# Patient Record
Sex: Male | Born: 1980 | Race: White | Hispanic: No | Marital: Single | Smoking: Light tobacco smoker
Health system: Southern US, Community
[De-identification: ages and names within clinical notes are randomized; demographics above are authoritative.]

## PROBLEM LIST (undated history)

## (undated) DIAGNOSIS — E271 Primary adrenocortical insufficiency: Secondary | ICD-10-CM

---

## 2015-08-28 ENCOUNTER — Inpatient Hospital Stay
Admission: EM | Admit: 2015-08-28 | Discharge: 2015-08-30 | DRG: 643 | Disposition: A | Discharge status: Home / Self Care | Payer: Medicare Other | Attending: Internal Medicine | Admitting: Internal Medicine

## 2015-08-28 ENCOUNTER — Emergency Department: Payer: Medicare Other

## 2015-08-28 ENCOUNTER — Encounter: Payer: Self-pay | Admitting: Emergency Medicine

## 2015-08-28 DIAGNOSIS — E271 Primary adrenocortical insufficiency: Secondary | ICD-10-CM

## 2015-08-28 DIAGNOSIS — H109 Unspecified conjunctivitis: Secondary | ICD-10-CM | POA: Diagnosis present

## 2015-08-28 DIAGNOSIS — G9341 Metabolic encephalopathy: Secondary | ICD-10-CM | POA: Diagnosis present

## 2015-08-28 DIAGNOSIS — E871 Hypo-osmolality and hyponatremia: Secondary | ICD-10-CM

## 2015-08-28 DIAGNOSIS — E872 Acidosis, unspecified: Secondary | ICD-10-CM

## 2015-08-28 DIAGNOSIS — E162 Hypoglycemia, unspecified: Secondary | ICD-10-CM | POA: Diagnosis present

## 2015-08-28 DIAGNOSIS — E861 Hypovolemia: Secondary | ICD-10-CM | POA: Diagnosis present

## 2015-08-28 DIAGNOSIS — R410 Disorientation, unspecified: Secondary | ICD-10-CM

## 2015-08-28 DIAGNOSIS — E860 Dehydration: Secondary | ICD-10-CM | POA: Diagnosis present

## 2015-08-28 DIAGNOSIS — Z720 Tobacco use: Secondary | ICD-10-CM

## 2015-08-28 DIAGNOSIS — E272 Addisonian crisis: Principal | ICD-10-CM | POA: Diagnosis present

## 2015-08-28 DIAGNOSIS — N170 Acute kidney failure with tubular necrosis: Secondary | ICD-10-CM | POA: Diagnosis present

## 2015-08-28 DIAGNOSIS — Z9114 Patient's other noncompliance with medication regimen: Secondary | ICD-10-CM

## 2015-08-28 HISTORY — DX: Primary adrenocortical insufficiency: E27.1

## 2015-08-28 LAB — CBC WITH DIFFERENTIAL/PLATELET
BASOS ABS: 0 10*3/uL (ref 0–0.1)
Basophils Relative: 0 %
EOS ABS: 0.2 10*3/uL (ref 0–0.7)
EOS PCT: 3 %
HCT: 40.6 % (ref 40.0–52.0)
Hemoglobin: 13.3 g/dL (ref 13.0–18.0)
LYMPHS PCT: 28 %
Lymphs Abs: 2.1 10*3/uL (ref 1.0–3.6)
MCH: 27.4 pg (ref 26.0–34.0)
MCHC: 32.8 g/dL (ref 32.0–36.0)
MCV: 83.5 fL (ref 80.0–100.0)
MONO ABS: 0.8 10*3/uL (ref 0.2–1.0)
Monocytes Relative: 10 %
Neutro Abs: 4.3 10*3/uL (ref 1.4–6.5)
Neutrophils Relative %: 59 %
PLATELETS: 251 10*3/uL (ref 150–440)
RBC: 4.85 MIL/uL (ref 4.40–5.90)
RDW: 16.3 % — AB (ref 11.5–14.5)
WBC: 7.4 10*3/uL (ref 3.8–10.6)

## 2015-08-28 MED ORDER — DEXTROSE-NACL 5-0.45 % IV SOLN
INTRAVENOUS | Status: DC
  Administered 2015-08-28 – 2015-08-29 (×2): via INTRAVENOUS

## 2015-08-28 MED ORDER — HYDROCORTISONE NA SUCCINATE PF 100 MG IJ SOLR
INTRAMUSCULAR | Status: AC
  Filled 2015-08-28: qty 2

## 2015-08-28 MED ORDER — HYDROCORTISONE NA SUCCINATE PF 100 MG IJ SOLR
100.0000 mg | Freq: Once | INTRAMUSCULAR | Status: AC
  Administered 2015-08-28: 100 mg via INTRAVENOUS

## 2015-08-28 NOTE — ED Notes (Signed)
MD at bedside. 

## 2015-08-28 NOTE — ED Notes (Signed)
Pt arrived via EMS from [employer]; a trucker called 911 to report pt in truck for several days and needed a welfare check; pt arrived awake and alert; confused to place and time; eyes red with large circles around eyes; eyes watering; pt denies pain; pt denies fall/injury;

## 2015-08-28 NOTE — ED Notes (Signed)
Pt's father called to check on pt; unaware of who reported pt's arrival to ED; father says he's in [place] and will coming this way

## 2015-08-28 NOTE — ED Provider Notes (Signed)
[facility] Emergency Department Provider Note  ____________________________________________  Time seen: Approximately 11:49 PM  I have reviewed the triage vital signs and the nursing notes.   HISTORY  Chief Complaint Altered Mental Status  Limited by confusion  HPI Benjamin Kelley is a 35 y.o. male who presents to the ED from a truck stop with a chief complaint of confusion. Patient has a medical history significant for Addison's disease. Reports he is taking his medicines twice daily as instructed; however his medicine bottle found in his truck is completely fall. 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 Patient does not recall how he got there or were he was going. Voices no complaints of pain. Denies fever, chills, chest pain, shortness breath, abdominal pain, nausea, vomiting, diarrhea. Reports recent nonproductive cough. Denies trauma, fall, injury.   Past Medical History  Diagnosis Date  . Addison's disease (HCC)     There are no active problems to display for this patient.   History reviewed. No pertinent past surgical history.  Current Outpatient Rx  Name  Route  Sig  Dispense  Refill  . fludrocortisone (FLORINEF) 0.1 MG tablet   Oral   Take 0.1 mg by mouth 2 (two) times daily.           Allergies Review of patient's allergies indicates no known allergies.  History reviewed. No pertinent family history.  Social History Social History  Substance Use Topics  . Smoking status: Never Smoker   . Smokeless tobacco: None  . Alcohol Use: No    Review of Systems Constitutional: No fever/chills Eyes: No visual changes. ENT: No sore throat. Cardiovascular: Denies chest pain. Respiratory: Positive for nonproductive cough. Denies shortness of breath. Gastrointestinal: No abdominal pain.  No nausea, no vomiting.  No diarrhea.  No  constipation. Genitourinary: Negative for dysuria. Musculoskeletal: Negative for back pain. Skin: Negative for rash. Neurological: Negative for headaches, focal weakness or numbness.  Limited by confusion; 10-point ROS otherwise negative.  ____________________________________________   PHYSICAL EXAM:  VITAL SIGNS: ED Triage Vitals  Enc Vitals Group     BP 08/28/15 2335 88/47 mmHg     Pulse Rate 08/28/15 2335 115     Resp 08/28/15 2335 21     Temp 08/28/15 2335 98.8 F (37.1 C)     Temp Source 08/28/15 2335 Oral     SpO2 08/28/15 2335 100 %     Weight 08/28/15 2335 189 lb 9.5 oz (86 kg)     Height 08/28/15 2335 5\' 6"  (1.676 m)     Head Cir --      Peak Flow --      Pain Score 08/28/15 2338 0     Pain Loc --      Pain Edu? --      Excl. in GC? --     Constitutional: Alert and oriented. Disheveled, dirty and in no acute distress. Eyes: Bilateral conjunctivae exudates. PERRL. EOMI. Head: Atraumatic. Nose: No congestion/rhinnorhea. Mouth/Throat: Mucous membranes are dry.  Oropharynx non-erythematous. Neck: No stridor.  No carotid bruits. No cervical spinal tenderness to palpation. No step-offs or deformities. Cardiovascular: Normal rate, regular rhythm. Grossly normal heart sounds.  Good peripheral circulation. Respiratory: Normal respiratory effort.  No retractions. Lungs CTAB. Gastrointestinal: Soft and nontender. No distention. No abdominal bruits. No CVA tenderness. Musculoskeletal: No lower extremity tenderness nor edema.  No joint effusions. Neurologic:  Alert and  oriented times person only. Normal speech and language. CN II-XII grossly intact. Moving all extremities 4, although delayed. No gross focal neurologic deficits are appreciated.  Skin:  Skin is warm, dry and intact. No rash noted. Psychiatric: Mood and affect are normal. Speech and behavior are normal.  ____________________________________________   LABS (all labs ordered are listed, but only abnormal  results are displayed)  Labs Reviewed  CBC WITH DIFFERENTIAL/PLATELET - Abnormal; Notable for the following:    RDW 16.3 (*)    All other components within normal limits  COMPREHENSIVE METABOLIC PANEL - Abnormal; Notable for the following:    Sodium 127 (*)    Chloride 94 (*)    CO2 17 (*)    Glucose, Bld 57 (*)    BUN 22 (*)    Creatinine, Ser 1.50 (*)    Total Bilirubin 2.4 (*)    GFR calc non Af Amer 60 (*)    Anion gap 16 (*)    All other components within normal limits  ACETAMINOPHEN LEVEL - Abnormal; Notable for the following:    Acetaminophen (Tylenol), Serum <10 (*)    All other components within normal limits  CULTURE, BLOOD (ROUTINE X 2)  CULTURE, BLOOD (ROUTINE X 2)  URINE CULTURE  ETHANOL  TROPONIN I  SALICYLATE LEVEL  URINALYSIS COMPLETEWITH MICROSCOPIC ([facility] ONLY)  URINE DRUG SCREEN, QUALITATIVE ([facility] ONLY)  LACTIC ACID, PLASMA  BLOOD GAS, VENOUS   ____________________________________________  EKG  ED ECG REPORT I, SUNG,JADE J, the attending physician, personally viewed and interpreted this ECG.   Date: 08/28/2015  EKG Time: 2335  Rate: 117  Rhythm: sinus tachycardia  Axis: Normal  Intervals:none  ST&T Change: Nonspecific  ____________________________________________  RADIOLOGY  CT head without contrast interpreted per Dr. Manus Gunning: No acute intracranial abnormality.  Portable chest x-ray (viewed by me, interpreted per Dr. Manus Gunning): No acute pulmonary process. ____________________________________________   PROCEDURES  Procedure(s) performed: None  Critical Care performed: Yes, see critical care note(s)   CRITICAL CARE Performed by: Irean Hong   Total critical care time: 30 minutes  Critical care time was exclusive of separately billable procedures and treating other patients.  Critical care was necessary to treat or prevent imminent or life-threatening deterioration.  Critical care was time spent personally by me on the  following activities: development of treatment plan with patient and/or surrogate as well as nursing, discussions with consultants, evaluation of patient's response to treatment, examination of patient, obtaining history from patient or surrogate, ordering and performing treatments and interventions, ordering and review of laboratory studies, ordering and review of radiographic studies, pulse oximetry and re-evaluation of patient's condition.  ____________________________________________   INITIAL IMPRESSION / ASSESSMENT AND PLAN / ED COURSE  Pertinent labs & imaging results that were available during my care of the patient were reviewed by me and considered in my medical decision making (see chart for details).  35 year old male with a history of Addison's disease who presents with altered mental status. He is unlikely taking his Florinef as the bottle is completely full. Will initiate stress dose hydrocortisone. D5 1/2NS to be started for patients with blood sugar of 60. Will obtain screening lab work including lactate, venous blood gas and obtain CT imaging of head to evaluate for intracranial hemorrhage.  ----------------------------------------- 12:52 AM on 08/29/2015 -----------------------------------------  Patient is resting in no acute distress. Updated him of laboratory and imaging studies. Will discuss with hospitalist for evaluation in the ED for admission. ____________________________________________   FINAL CLINICAL IMPRESSION(S) / ED DIAGNOSES  Final diagnoses:  Hypoglycemia  Addison disease (HCC)  Hyponatremia  Confusion  Metabolic acidosis      Irean Hong, MD 08/29/15 [phone]

## 2015-08-29 ENCOUNTER — Encounter: Payer: Self-pay | Admitting: *Deleted

## 2015-08-29 ENCOUNTER — Emergency Department: Payer: Medicare Other

## 2015-08-29 DIAGNOSIS — E871 Hypo-osmolality and hyponatremia: Secondary | ICD-10-CM | POA: Diagnosis present

## 2015-08-29 DIAGNOSIS — E860 Dehydration: Secondary | ICD-10-CM | POA: Diagnosis present

## 2015-08-29 DIAGNOSIS — Z9114 Patient's other noncompliance with medication regimen: Secondary | ICD-10-CM | POA: Diagnosis not present

## 2015-08-29 DIAGNOSIS — N170 Acute kidney failure with tubular necrosis: Secondary | ICD-10-CM | POA: Diagnosis present

## 2015-08-29 DIAGNOSIS — E271 Primary adrenocortical insufficiency: Secondary | ICD-10-CM | POA: Diagnosis present

## 2015-08-29 DIAGNOSIS — G9341 Metabolic encephalopathy: Secondary | ICD-10-CM | POA: Diagnosis present

## 2015-08-29 DIAGNOSIS — E861 Hypovolemia: Secondary | ICD-10-CM | POA: Diagnosis present

## 2015-08-29 DIAGNOSIS — E162 Hypoglycemia, unspecified: Secondary | ICD-10-CM | POA: Diagnosis present

## 2015-08-29 DIAGNOSIS — E272 Addisonian crisis: Secondary | ICD-10-CM | POA: Diagnosis present

## 2015-08-29 DIAGNOSIS — E872 Acidosis: Secondary | ICD-10-CM | POA: Diagnosis present

## 2015-08-29 DIAGNOSIS — H109 Unspecified conjunctivitis: Secondary | ICD-10-CM | POA: Diagnosis present

## 2015-08-29 DIAGNOSIS — Z720 Tobacco use: Secondary | ICD-10-CM | POA: Diagnosis not present

## 2015-08-29 LAB — BASIC METABOLIC PANEL
Anion gap: 8 (ref 5–15)
BUN: 14 mg/dL (ref 6–20)
CALCIUM: 8.8 mg/dL — AB (ref 8.9–10.3)
CO2: 19 mmol/L — ABNORMAL LOW (ref 22–32)
CREATININE: 0.98 mg/dL (ref 0.61–1.24)
Chloride: 102 mmol/L (ref 101–111)
GFR calc non Af Amer: >60 mL/min (ref >60)
Glucose, Bld: 317 mg/dL — ABNORMAL HIGH (ref 65–99)
Potassium: 4.3 mmol/L (ref 3.5–5.1)
SODIUM: 129 mmol/L — AB (ref 135–145)

## 2015-08-29 LAB — URINALYSIS COMPLETE WITH MICROSCOPIC (ARMC ONLY)
BACTERIA UA: NONE SEEN
Bilirubin Urine: NEGATIVE
GLUCOSE, UA: NEGATIVE mg/dL
LEUKOCYTES UA: NEGATIVE
Nitrite: NEGATIVE
PROTEIN: NEGATIVE mg/dL
SQUAMOUS EPITHELIAL / LPF: NONE SEEN
Specific Gravity, Urine: 1.008 (ref 1.005–1.030)
pH: 5 (ref 5.0–8.0)

## 2015-08-29 LAB — BLOOD GAS, VENOUS
Acid-base deficit: 10.3 mmol/L — ABNORMAL HIGH (ref 0.0–2.0)
BICARBONATE: 15.6 meq/L — AB (ref 21.0–28.0)
FIO2: 0.21
PCO2 VEN: 34 mmHg — AB (ref 44.0–60.0)
PH VEN: 7.27 — AB (ref 7.320–7.430)
Patient temperature: 37

## 2015-08-29 LAB — COMPREHENSIVE METABOLIC PANEL
ALBUMIN: 3.8 g/dL (ref 3.5–5.0)
ALK PHOS: 81 U/L (ref 38–126)
ALT: 16 U/L — ABNORMAL LOW (ref 17–63)
ALT: 20 U/L (ref 17–63)
ANION GAP: 10 (ref 5–15)
ANION GAP: 16 — AB (ref 5–15)
AST: 17 U/L (ref 15–41)
AST: 22 U/L (ref 15–41)
Albumin: 3.1 g/dL — ABNORMAL LOW (ref 3.5–5.0)
Alkaline Phosphatase: 64 U/L (ref 38–126)
BILIRUBIN TOTAL: 2.4 mg/dL — AB (ref 0.3–1.2)
BUN: 18 mg/dL (ref 6–20)
BUN: 22 mg/dL — ABNORMAL HIGH (ref 6–20)
CALCIUM: 9.5 mg/dL (ref 8.9–10.3)
CHLORIDE: 99 mmol/L — AB (ref 101–111)
CO2: 17 mmol/L — AB (ref 22–32)
CO2: 18 mmol/L — ABNORMAL LOW (ref 22–32)
Calcium: 8.6 mg/dL — ABNORMAL LOW (ref 8.9–10.3)
Chloride: 94 mmol/L — ABNORMAL LOW (ref 101–111)
Creatinine, Ser: 1.3 mg/dL — ABNORMAL HIGH (ref 0.61–1.24)
Creatinine, Ser: 1.5 mg/dL — ABNORMAL HIGH (ref 0.61–1.24)
GFR calc non Af Amer: 60 mL/min — ABNORMAL LOW (ref >60)
GFR calc non Af Amer: >60 mL/min (ref >60)
GLUCOSE: 57 mg/dL — AB (ref 65–99)
Glucose, Bld: 382 mg/dL — ABNORMAL HIGH (ref 65–99)
POTASSIUM: 4.2 mmol/L (ref 3.5–5.1)
POTASSIUM: 4.4 mmol/L (ref 3.5–5.1)
SODIUM: 127 mmol/L — AB (ref 135–145)
Sodium: 127 mmol/L — ABNORMAL LOW (ref 135–145)
TOTAL PROTEIN: 7.4 g/dL (ref 6.5–8.1)
Total Bilirubin: 2.1 mg/dL — ABNORMAL HIGH (ref 0.3–1.2)
Total Protein: 6.1 g/dL — ABNORMAL LOW (ref 6.5–8.1)

## 2015-08-29 LAB — CBC
HEMATOCRIT: 33.3 % — AB (ref 40.0–52.0)
Hemoglobin: 11 g/dL — ABNORMAL LOW (ref 13.0–18.0)
MCH: 27.5 pg (ref 26.0–34.0)
MCHC: 33.1 g/dL (ref 32.0–36.0)
MCV: 83.2 fL (ref 80.0–100.0)
Platelets: 212 10*3/uL (ref 150–440)
RBC: 4 MIL/uL — AB (ref 4.40–5.90)
RDW: 16.1 % — ABNORMAL HIGH (ref 11.5–14.5)
WBC: 4.1 10*3/uL (ref 3.8–10.6)

## 2015-08-29 LAB — TSH: TSH: 5.8 u[IU]/mL — ABNORMAL HIGH (ref 0.350–4.500)

## 2015-08-29 LAB — URINE DRUG SCREEN, QUALITATIVE (ARMC ONLY)
AMPHETAMINES, UR SCREEN: NOT DETECTED
Barbiturates, Ur Screen: NOT DETECTED
Benzodiazepine, Ur Scrn: NOT DETECTED
COCAINE METABOLITE, UR ~~LOC~~: NOT DETECTED
Cannabinoid 50 Ng, Ur ~~LOC~~: NOT DETECTED
MDMA (ECSTASY) UR SCREEN: NOT DETECTED
METHADONE SCREEN, URINE: NOT DETECTED
Opiate, Ur Screen: NOT DETECTED
Phencyclidine (PCP) Ur S: NOT DETECTED
TRICYCLIC, UR SCREEN: NOT DETECTED

## 2015-08-29 LAB — GLUCOSE, CAPILLARY
GLUCOSE-CAPILLARY: 198 mg/dL — AB (ref 65–99)
GLUCOSE-CAPILLARY: 224 mg/dL — AB (ref 65–99)
GLUCOSE-CAPILLARY: 255 mg/dL — AB (ref 65–99)
GLUCOSE-CAPILLARY: 289 mg/dL — AB (ref 65–99)
GLUCOSE-CAPILLARY: 60 mg/dL — AB (ref 65–99)
Glucose-Capillary: 280 mg/dL — ABNORMAL HIGH (ref 65–99)

## 2015-08-29 LAB — CK: Total CK: 63 U/L (ref 49–397)

## 2015-08-29 LAB — AMMONIA: Ammonia: 19 umol/L (ref 9–35)

## 2015-08-29 LAB — ETHANOL: Alcohol, Ethyl (B): <5 mg/dL (ref <5)

## 2015-08-29 LAB — TROPONIN I: TROPONIN I: 0.03 ng/mL (ref <0.031)

## 2015-08-29 LAB — SALICYLATE LEVEL

## 2015-08-29 LAB — LACTIC ACID, PLASMA: LACTIC ACID, VENOUS: 0.9 mmol/L (ref 0.5–2.0)

## 2015-08-29 LAB — ACETAMINOPHEN LEVEL

## 2015-08-29 MED ORDER — ONDANSETRON HCL 4 MG PO TABS: 4.0000 mg | ORAL_TABLET | Freq: Four times a day (QID) | ORAL | Status: DC | PRN

## 2015-08-29 MED ORDER — SODIUM CHLORIDE 0.9 % IJ SOLN
3.0000 mL | Freq: Two times a day (BID) | INTRAMUSCULAR | Status: DC
Start: 2015-08-29 — End: 2015-08-30
  Administered 2015-08-29 (×2): 3 mL via INTRAVENOUS

## 2015-08-29 MED ORDER — POLYETHYLENE GLYCOL 3350 17 G PO PACK: 17.0000 g | PACK | Freq: Every day | ORAL | Status: DC | PRN

## 2015-08-29 MED ORDER — ALUM & MAG HYDROXIDE-SIMETH 200-200-20 MG/5ML PO SUSP: 30.0000 mL | Freq: Four times a day (QID) | ORAL | Status: DC | PRN

## 2015-08-29 MED ORDER — ENOXAPARIN SODIUM 40 MG/0.4ML ~~LOC~~ SOLN
40.0000 mg | SUBCUTANEOUS | Status: DC
  Administered 2015-08-29: 40 mg via SUBCUTANEOUS
  Filled 2015-08-29: qty 0.4

## 2015-08-29 MED ORDER — HYDROCORTISONE NA SUCCINATE PF 100 MG IJ SOLR
100.0000 mg | Freq: Three times a day (TID) | INTRAMUSCULAR | Status: AC
  Administered 2015-08-29 (×3): 100 mg via INTRAVENOUS
  Filled 2015-08-29 (×4): qty 2

## 2015-08-29 MED ORDER — DEXTROSE 5 % IV SOLN
INTRAVENOUS | Status: DC
  Administered 2015-08-29: 11:00:00 via INTRAVENOUS

## 2015-08-29 MED ORDER — ACETAMINOPHEN 325 MG PO TABS: 650.0000 mg | ORAL_TABLET | Freq: Four times a day (QID) | ORAL | Status: DC | PRN

## 2015-08-29 MED ORDER — DEXTROSE-NACL 5-0.9 % IV SOLN
INTRAVENOUS | Status: DC
  Administered 2015-08-29: 03:00:00 via INTRAVENOUS

## 2015-08-29 MED ORDER — FLUDROCORTISONE ACETATE 0.1 MG PO TABS
0.1000 mg | ORAL_TABLET | Freq: Two times a day (BID) | ORAL | Status: DC
  Administered 2015-08-29 – 2015-08-30 (×4): 0.1 mg via ORAL
  Filled 2015-08-29 (×4): qty 1

## 2015-08-29 MED ORDER — TOBRAMYCIN 0.3 % OP SOLN
2.0000 [drp] | OPHTHALMIC | Status: DC
  Administered 2015-08-29 – 2015-08-30 (×10): 2 [drp] via OPHTHALMIC
  Filled 2015-08-29: qty 5

## 2015-08-29 MED ORDER — SODIUM CHLORIDE 0.9 % IV BOLUS (SEPSIS)
2000.0000 mL | Freq: Once | INTRAVENOUS | Status: AC
Start: 2015-08-29 — End: 2015-08-29
  Administered 2015-08-29: 1000 mL via INTRAVENOUS

## 2015-08-29 MED ORDER — INSULIN ASPART 100 UNIT/ML ~~LOC~~ SOLN
0.0000 [IU] | Freq: Three times a day (TID) | SUBCUTANEOUS | Status: DC
  Administered 2015-08-29: 3 [IU] via SUBCUTANEOUS
  Administered 2015-08-29: 5 [IU] via SUBCUTANEOUS
  Administered 2015-08-30: 2 [IU] via SUBCUTANEOUS
  Administered 2015-08-30: 1 [IU] via SUBCUTANEOUS
  Administered 2015-08-30: 2 [IU] via SUBCUTANEOUS
  Filled 2015-08-29: qty 1
  Filled 2015-08-29: qty 2
  Filled 2015-08-29: qty 3
  Filled 2015-08-29: qty 5

## 2015-08-29 MED ORDER — ONDANSETRON HCL 4 MG/2ML IJ SOLN: 4.0000 mg | Freq: Four times a day (QID) | INTRAMUSCULAR | Status: DC | PRN

## 2015-08-29 MED ORDER — ACETAMINOPHEN 650 MG RE SUPP
650.0000 mg | Freq: Four times a day (QID) | RECTAL | Status: DC | PRN
Start: 2015-08-29 — End: 2015-08-30

## 2015-08-29 MED ORDER — ALBUTEROL SULFATE (2.5 MG/3ML) 0.083% IN NEBU: 2.5000 mg | INHALATION_SOLUTION | RESPIRATORY_TRACT | Status: DC | PRN

## 2015-08-29 MED ORDER — SODIUM CHLORIDE 0.9 % IV SOLN
INTRAVENOUS | Status: DC
  Administered 2015-08-29 (×2): via INTRAVENOUS

## 2015-08-29 NOTE — ED Notes (Signed)
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

## 2015-08-29 NOTE — Progress Notes (Signed)
[facility] - [place] at [facility]   PATIENT NAME: Benjamin Kelley    MR#:  [id number]  DATE OF BIRTH:  1981-08-16  SUBJECTIVE:   Patient was brought in for altered mental status and confusion and found to have hypotension and mild hypoglycemia and thought to have adrenal crisis.  REVIEW OF SYSTEMS:    Review of Systems  Constitutional: Negative for fever, chills and malaise/fatigue.  HENT: Negative for sore throat.   Eyes: Negative for blurred vision.  Respiratory: Negative for cough, hemoptysis, shortness of breath and wheezing.   Cardiovascular: Negative for chest pain, palpitations and leg swelling.  Gastrointestinal: Negative for nausea, vomiting, abdominal pain, diarrhea and blood in stool.  Genitourinary: Negative for dysuria.  Musculoskeletal: Negative for back pain.  Neurological: Negative for dizziness, tremors and headaches.  Endo/Heme/Allergies: Does not bruise/bleed easily.    Tolerating Diet: Yes      DRUG ALLERGIES:  No Known Allergies  VITALS:  Blood pressure 108/66, pulse 112, temperature 100.2 F (37.9 C), temperature source Oral, resp. rate 24, height 5\' 6"  (1.676 m), weight 71.487 kg (157 lb 9.6 oz), SpO2 100 %.  PHYSICAL EXAMINATION:   Physical Exam  Constitutional: He is oriented to person, place, and time and well-developed, well-nourished, and in no distress. No distress.  Disheveled  HENT:  Head: Normocephalic.  Eyes: No scleral icterus.  Neck: Normal range of motion. Neck supple. No JVD present. No tracheal deviation present.  Cardiovascular: Normal rate, regular rhythm and normal heart sounds.  Exam reveals no gallop and no friction rub.   No murmur heard. Pulmonary/Chest: Effort normal and breath sounds normal. No respiratory distress. He has no wheezes. He has no rales. He exhibits no tenderness.  Abdominal: Soft. Bowel sounds are normal. He exhibits no distension and no mass. There is no tenderness. There is  no rebound and no guarding.  Musculoskeletal: Normal range of motion. He exhibits no edema.  Neurological: He is alert and oriented to person, place, and time.  Skin: Skin is warm. No rash noted. No erythema.  Chronic skin changes lower extremity  Psychiatric: Affect and judgment normal.      LABORATORY PANEL:   CBC  Recent Labs Lab 08/29/15 0451  WBC 4.1  HGB 11.0*  HCT 33.3*  PLT 212   ------------------------------------------------------------------------------------------------------------------  Chemistries   Recent Labs Lab 08/29/15 0451  NA 127*  K 4.2  CL 99*  CO2 18*  GLUCOSE 382*  BUN 18  CREATININE 1.30*  CALCIUM 8.6*  AST 17  ALT 16*  ALKPHOS 64  BILITOT 2.1*   ------------------------------------------------------------------------------------------------------------------  Cardiac Enzymes  Recent Labs Lab 08/28/15 2345  TROPONINI 0.03   ------------------------------------------------------------------------------------------------------------------  RADIOLOGY:  Ct Head Wo Contrast  08/29/2015  CLINICAL DATA:  Altered mental status. EXAM: CT HEAD WITHOUT CONTRAST TECHNIQUE: Contiguous axial images were obtained from the base of the skull through the vertex without intravenous contrast. COMPARISON:  None. FINDINGS: No intracranial hemorrhage, mass effect, or midline shift. No hydrocephalus. The basilar cisterns are patent. No evidence of territorial infarct. No intracranial fluid collection. Mild scalp thickening of the posterior vertex. Calvarium is intact. Minimal mucosal thickening of the ethmoid air cells, no fluid levels. The mastoid air cells are well aerated. IMPRESSION: No acute intracranial abnormality. Electronically Signed   By: Rubye Oaks M.D.   On: 08/29/2015 00:37   Dg Chest Port 1 View  08/29/2015  CLINICAL DATA:  Altered mental status, confusion. EXAM: PORTABLE CHEST 1 VIEW COMPARISON:  None. FINDINGS:  The cardiomediastinal  contours are normal. The lungs are clear. Pulmonary vasculature is normal. No consolidation, pleural effusion, or pneumothorax. No acute osseous abnormalities are seen. IMPRESSION: No acute pulmonary process. Electronically Signed   By: Rubye OaksMelanie  Ehinger M.D.   On: 08/29/2015 00:47     ASSESSMENT AND PLAN:   35 year old male with history of adrenal crisis on Florinef at home brought in for altered mental status. Patient reports that he stopped taking his medications for the past few days.  1. Adrenal crisis: This is due to noncompliance with Florinef. Continue high-dose hydrocortisone steroids. Patient is restarted on Florinef. Endocrine consultation. Continue IV fluids.  2. Hypoglycemia: Patient is on D5 half-normal saline. Blood sugars are elevated.  I will repeat sodium level. If sodium level has improved then I can stop D5 half-normal saline.   3. Metabolic encephalopathy: Due to adrenal crisis. This is resolved.   4. Hyponatremia: Likely in the setting of hypovolemia and adrenal crises. Repeat sodium level. If improved then we'll consider stopping D5.  5. Acute kidney injury: This is due to hypovolemia. Continue IV fluids.     Management plans discussed with the patient and he is in agreement.  CODE STATUS: Full  TOTAL TIME TAKING CARE OF THIS PATIENT: 30 minutes.     POSSIBLE D/C 1-2 days, DEPENDING ON CLINICAL CONDITION.   Terell Kincy M.D on 08/29/2015 at 10:35 AM  Between 7am to 6pm - Pager - [phone] After 6pm go to [email or web address] - password [email or web address] [facility]  [facility]  Office  [phone]  CC: Primary care physician; No primary care provider on file.  Note: This dictation was prepared with Dragon dictation along with smaller phrase technology. Any transcriptional errors that result from this process are unintentional.

## 2015-08-29 NOTE — H&P (Signed)
[facility] - [place] at [facility]   PATIENT NAME: Benjamin Kelley    MR#:  [id number]  DATE OF BIRTH:  [birth year]  DATE OF ADMISSION:  08/28/2015  PRIMARY CARE PHYSICIAN: No primary care provider on file.   REQUESTING/REFERRING PHYSICIAN: Dr. Dolores Frame  CHIEF COMPLAINT:   Chief Complaint  Patient presents with  . Altered Mental Status    HISTORY OF PRESENT ILLNESS:  Benjamin Kelley  is a 35 y.o. 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 A fellow trucker called police for a social welfare check. Patient was found to have altered mental status/confusion and was brought to the emergency room. Here he had hypotension with systolic in the 80s, mild hypoglycemia at 57. Severely dehydrated. He had a bottle of Florinef which was full. Patient is from [place] and was headed for [place] in his truck. Fortunately patient's father was in [place] due to his work and was able to come to the emergency room. History obtained mostly from father. Patient able to contribute very little to history due to his confusion. Patient is only oriented to person.  He went through many similar episodes in his teenage years and was diagnosed with adrenal insufficiency and started on Florinef.  PAST MEDICAL HISTORY:   Past Medical History  Diagnosis Date  . Addison's disease (HCC)     PAST SURGICAL HISTORY:  History reviewed. No pertinent past surgical history.  SOCIAL HISTORY:   Social History  Substance Use Topics  . Smoking status: Never Smoker   . Smokeless tobacco: Not on file  . Alcohol Use: No    FAMILY HISTORY:  History reviewed. No pertinent family history.  DRUG ALLERGIES:  No Known Allergies  REVIEW OF SYSTEMS:   Review of Systems  Unable to perform ROS: mental status change    MEDICATIONS AT  HOME:   Prior to Admission medications   Medication Sig Start Date End Date Taking? Authorizing Provider  fludrocortisone (FLORINEF) 0.1 MG tablet Take 0.1 mg by mouth 2 (two) times daily.   Yes Historical Provider, MD      VITAL SIGNS:  Blood pressure 88/47, pulse 115, temperature 98.8 F (37.1 C), temperature source Oral, resp. rate 21, height 5\' 6"  (1.676 m), weight 86 kg (189 lb 9.5 oz), SpO2 100 %.  PHYSICAL EXAMINATION:  Physical Exam  GENERAL:  35 y.o.-year-old patient lying in the bed with no acute distress. Looks pale EYES: Pupils equal, round, reactive to light and accommodation. No scleral icterus. Extraocular muscles intact.  HEENT: Head atraumatic, normocephalic. Dry oral mucosa. Conjunctival injection with crusting. NECK:  Supple, no jugular venous distention. No thyroid enlargement, no tenderness.  LUNGS: Normal breath sounds bilaterally, no wheezing, rales, rhonchi. No use of accessory muscles of respiration.  CARDIOVASCULAR: S1, S2 normal. No murmurs, rubs, or gallops. Tachycardia ABDOMEN: Soft, nontender, nondistended. Bowel sounds present. No organomegaly or mass.  EXTREMITIES: No pedal edema, cyanosis, or clubbing. + 2 pedal & radial pulses b/l.   NEUROLOGIC: Cranial nerves II through XII are intact. No focal Motor or sensory deficits appreciated b/l PSYCHIATRIC: The patient is alert and awake. Restless. Oriented to person. SKIN: Dry scaly skin in lower extremity's with excoriation from scratching. No bleeding. No discharge.  LABORATORY PANEL:   CBC  Recent Labs Lab 08/28/15 2345  WBC 7.4  HGB 13.3  HCT 40.6  PLT 251   ------------------------------------------------------------------------------------------------------------------  Chemistries   Recent Labs Lab 08/28/15 2345  NA 127*  K 4.4  CL 94*  CO2 17*  GLUCOSE 57*  BUN 22*  CREATININE 1.50*  CALCIUM 9.5  AST 22  ALT 20  ALKPHOS 81  BILITOT 2.4*    ------------------------------------------------------------------------------------------------------------------  Cardiac Enzymes  Recent Labs Lab 08/28/15 2345  TROPONINI 0.03   ------------------------------------------------------------------------------------------------------------------  RADIOLOGY:  Ct Head Wo Contrast  08/29/2015  CLINICAL DATA:  Altered mental status. EXAM: CT HEAD WITHOUT CONTRAST TECHNIQUE: Contiguous axial images were obtained from the base of the skull through the vertex without intravenous contrast. COMPARISON:  None. FINDINGS: No intracranial hemorrhage, mass effect, or midline shift. No hydrocephalus. The basilar cisterns are patent. No evidence of territorial infarct. No intracranial fluid collection. Mild scalp thickening of the posterior vertex. Calvarium is intact. Minimal mucosal thickening of the ethmoid air cells, no fluid levels. The mastoid air cells are well aerated. IMPRESSION: No acute intracranial abnormality. Electronically Signed   By: Rubye OaksMelanie  Ehinger M.D.   On: 08/29/2015 00:37   Dg Chest Port 1 View  08/29/2015  CLINICAL DATA:  Altered mental status, confusion. EXAM: PORTABLE CHEST 1 VIEW COMPARISON:  None. FINDINGS: The cardiomediastinal contours are normal. The lungs are clear. Pulmonary vasculature is normal. No consolidation, pleural effusion, or pneumothorax. No acute osseous abnormalities are seen. IMPRESSION: No acute pulmonary process. Electronically Signed   By: Rubye OaksMelanie  Ehinger M.D.   On: 08/29/2015 00:47     IMPRESSION AND PLAN:   * Acute adrenal crisis Due to noncompliance with Florinef. Stat dose of Solu-Cortef IV 100 mg. Added 3 more doses. Restart Florinef. He does have mild hypotension and hypoglycemia secondary to this. Start D5 normal saline. We'll bolus 2 L normal saline first stat. Accu-Chek every 4 hours.  * Anion gap metabolic acidosis Bicarbonate levels are 17 and anion gap at 16. Check a venous blood gas. This  is likely from lactic acidosis. Check lactic acid level. Alcohol level normal. Her most Tylenol and salicylate levels.  * Acute encephalopathy Likely due to above CT scan of the head showed no acute changes. Had similar presentation during his prior episodes of adrenal crisis. Check urine drug screen. Check TSH and ammonia level.  * Acute renal failure with hyponatremia Due to severe dehydration and ATN from hypotension. On IV fluids. Check CK level. Repeat labs in the morning.  * Conjunctivitis Antibiotic eyedrops  * Hyperbilirubinemia with normal liver enzymes. No Abdominal tenderness and no history of liver disease. I suspect this is due to hemoconcentration. He will need further workup including ultrasound of the abdomen if he still has persistent hyperbilirubinemia after fluid resuscitation.  * DVT prophylaxis with Lovenox  All the records are reviewed and case discussed with ED provider. Management plans discussed with the patient, family and they are in agreement.  CODE STATUS: FULL  TOTAL CRITICAL CARE TIME TAKING CARE OF THIS PATIENT: 40 minutes.    Milagros LollSudini, Azriel Jakob R M.D on 08/29/2015 at 1:18 AM  Between 7am to 6pm - Pager - [phone]  After 6pm go to [email or web address] - password [email or web address] [facility]  [facility]  Office  [phone]  CC: Primary care physician; No primary care provider on file.  Note: This dictation was prepared with Dragon dictation along with smaller phrase technology. Any transcriptional errors that result from this process are unintentional.

## 2015-08-29 NOTE — ED Notes (Signed)
Patient transported to CT 

## 2015-08-29 NOTE — Progress Notes (Signed)
Inpatient Diabetes Program Recommendations  AACE/ADA: New Consensus Statement on Inpatient Glycemic Control (2015)  Target Ranges:  Prepandial:   less than 140 mg/dL      Peak postprandial:   less than 180 mg/dL (1-2 hours)      Critically ill patients:  140 - 180 mg/dL  Results for Benjamin Kelley, Benjamin Kelley (MRN [id number]) as of 08/29/2015 09:56  Ref. Range 08/28/2015 23:40 08/29/2015 03:35 08/29/2015 07:31  Glucose-Capillary Latest Ref Range: 65-99 mg/dL 60 (L) 409255 (H) 811289 (H)   Review of Glycemic Control  Diabetes history: No Outpatient Diabetes medications: NA Current orders for Inpatient glycemic control: None  Inpatient Diabetes Program Recommendations: Correction (SSI): Patient does not have a documented history of diabetes. Glucose 289 mg/dl this morning. Hyperglycemia likely due to steroids. While inpatient and ordered steroids, please consider ordering CBGs with Novolog sensitive correction scale.  Thanks, Orlando PennerMarie Kristen Bushway, RN, MSN, CDE Diabetes Coordinator Inpatient Diabetes Program [phone] (Team Pager from 8am to 5pm) [phone] ([facility] office) [phone] [facility] office) [phone] [facility] office)

## 2015-08-30 ENCOUNTER — Inpatient Hospital Stay (HOSPITAL_COMMUNITY)
Admit: 2015-08-30 | Discharge: 2015-08-30 | Disposition: A | Discharge status: Home / Self Care | Payer: Medicare Other | Attending: Internal Medicine | Admitting: Internal Medicine

## 2015-08-30 DIAGNOSIS — G9340 Encephalopathy, unspecified: Secondary | ICD-10-CM

## 2015-08-30 DIAGNOSIS — R509 Fever, unspecified: Secondary | ICD-10-CM

## 2015-08-30 LAB — URINE CULTURE
CULTURE: NO GROWTH
Special Requests: NORMAL

## 2015-08-30 LAB — GLUCOSE, CAPILLARY
GLUCOSE-CAPILLARY: 155 mg/dL — AB (ref 65–99)
GLUCOSE-CAPILLARY: 132 mg/dL — AB (ref 65–99)
Glucose-Capillary: 159 mg/dL — ABNORMAL HIGH (ref 65–99)

## 2015-08-30 LAB — BLOOD CULTURE ID PANEL (REFLEXED)
ACINETOBACTER BAUMANNII: NOT DETECTED
CANDIDA PARAPSILOSIS: NOT DETECTED
CARBAPENEM RESISTANCE: NOT DETECTED
Candida albicans: NOT DETECTED
Candida glabrata: NOT DETECTED
Candida krusei: NOT DETECTED
Candida tropicalis: NOT DETECTED
ENTEROCOCCUS SPECIES: NOT DETECTED
Enterobacter cloacae complex: NOT DETECTED
Enterobacteriaceae species: NOT DETECTED
Escherichia coli: NOT DETECTED
HAEMOPHILUS INFLUENZAE: NOT DETECTED
KLEBSIELLA OXYTOCA: NOT DETECTED
Klebsiella pneumoniae: NOT DETECTED
LISTERIA MONOCYTOGENES: NOT DETECTED
METHICILLIN RESISTANCE: NOT DETECTED
Neisseria meningitidis: NOT DETECTED
PROTEUS SPECIES: NOT DETECTED
PSEUDOMONAS AERUGINOSA: NOT DETECTED
SERRATIA MARCESCENS: NOT DETECTED
STREPTOCOCCUS SPECIES: NOT DETECTED
Staphylococcus aureus (BCID): NOT DETECTED
Staphylococcus species: DETECTED — AB
Streptococcus agalactiae: NOT DETECTED
Streptococcus pneumoniae: NOT DETECTED
Streptococcus pyogenes: NOT DETECTED
VANCOMYCIN RESISTANCE: NOT DETECTED

## 2015-08-30 LAB — BASIC METABOLIC PANEL
ANION GAP: 8 (ref 5–15)
BUN: 9 mg/dL (ref 6–20)
CO2: 20 mmol/L — AB (ref 22–32)
Calcium: 8.8 mg/dL — ABNORMAL LOW (ref 8.9–10.3)
Chloride: 109 mmol/L (ref 101–111)
Creatinine, Ser: 0.76 mg/dL (ref 0.61–1.24)
GFR calc Af Amer: >60 mL/min (ref >60)
GFR calc non Af Amer: >60 mL/min (ref >60)
GLUCOSE: 180 mg/dL — AB (ref 65–99)
POTASSIUM: 4 mmol/L (ref 3.5–5.1)
Sodium: 137 mmol/L (ref 135–145)

## 2015-08-30 LAB — CBC
HCT: 29.6 % — ABNORMAL LOW (ref 40.0–52.0)
Hemoglobin: 9.9 g/dL — ABNORMAL LOW (ref 13.0–18.0)
MCH: 26.9 pg (ref 26.0–34.0)
MCHC: 33.6 g/dL (ref 32.0–36.0)
MCV: 80 fL (ref 80.0–100.0)
PLATELETS: 239 10*3/uL (ref 150–440)
RBC: 3.7 MIL/uL — ABNORMAL LOW (ref 4.40–5.90)
RDW: 16 % — ABNORMAL HIGH (ref 11.5–14.5)
WBC: 3.5 10*3/uL — ABNORMAL LOW (ref 3.8–10.6)

## 2015-08-30 MED ORDER — VANCOMYCIN HCL IN DEXTROSE 750-5 MG/150ML-% IV SOLN
750.0000 mg | Freq: Three times a day (TID) | INTRAVENOUS | Status: DC
  Administered 2015-08-30: 750 mg via INTRAVENOUS
  Filled 2015-08-30 (×4): qty 150

## 2015-08-30 MED ORDER — VANCOMYCIN HCL IN DEXTROSE 750-5 MG/150ML-% IV SOLN
750.0000 mg | Freq: Once | INTRAVENOUS | Status: AC
  Administered 2015-08-30: 750 mg via INTRAVENOUS
  Filled 2015-08-30: qty 150

## 2015-08-30 MED ORDER — POLYETHYLENE GLYCOL 3350 17 G PO PACK: 17.0000 g | PACK | Freq: Every day | ORAL | Status: AC | PRN

## 2015-08-30 MED ORDER — FLUDROCORTISONE ACETATE 0.1 MG PO TABS: 0.1000 mg | ORAL_TABLET | Freq: Two times a day (BID) | ORAL | Status: DC

## 2015-08-30 MED ORDER — HYDROCORTISONE 5 MG PO TABS: ORAL_TABLET | ORAL | Status: AC

## 2015-08-30 MED ORDER — HYDROCORTISONE 10 MG PO TABS
20.0000 mg | ORAL_TABLET | Freq: Every day | ORAL | Status: DC
  Administered 2015-08-30: 20 mg via ORAL
  Filled 2015-08-30 (×2): qty 2

## 2015-08-30 MED ORDER — TOBRAMYCIN 0.3 % OP SOLN: 2.0000 [drp] | OPHTHALMIC | Status: AC

## 2015-08-30 MED ORDER — HYDROCORTISONE 10 MG PO TABS
40.0000 mg | ORAL_TABLET | Freq: Every day | ORAL | Status: DC
  Administered 2015-08-30: 40 mg via ORAL
  Filled 2015-08-30 (×2): qty 4

## 2015-08-30 MED ORDER — FLUDROCORTISONE ACETATE 0.1 MG PO TABS: 0.1000 mg | ORAL_TABLET | Freq: Two times a day (BID) | ORAL | Status: AC

## 2015-08-30 NOTE — Consult Note (Signed)
Endocrine Initial Consult Note Date of Consult: 08/30/2015  Consulting Service: [facility] Endocrinology  Service Requesting Consult: Dr. Elpidio Anis  SUBJECTIVE: Reason for Consultation: adrenal crisis  History of Present Illness: Benjamin Kelley is a 35 y.o. male with PMH Addison's disease admitted with adrenal crisis. 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 He was found in his truck after someone noticed he was parked at a station for days reportedly. He was mentally altered and upon presentation was hypotensive and hypoglycemic. The patient reports recently he was experiencing some heat intolerance and sweats but did not notice and fever. He was feeling unwell. Reports he was taking his hydrocortisone and fludrocortisone but is unable to recall his doses. He has an Actor in [place] but has not been seen in almost a year because he has been so busy. Addison's disease was diagnosed about 11 years ago in the context of weight loss, generalized weakness, nausea, abdominal pain, skin hyperpigmentation.  He is currently receiving florinef 0.1 mg twice daily. He was treated with IV hydrocortisone 100 mg q8hr upon admission but that was discontinued yesterday.  He denies any nausea, vomiting, abdominal pain. He is eating without issues and receiving IV fluids.  Patient Active Problem List   Diagnosis Date Noted  . Adrenal crisis (HCC) 08/29/2015     Past Medical History  Diagnosis Date  . Addison's disease (HCC)    History reviewed. No pertinent past surgical history. History reviewed. No pertinent family history.  Social History:  Social History  Substance Use Topics  . Smoking status: Light Tobacco Smoker  . Smokeless tobacco: Not on file  . Alcohol Use: No    No Known Allergies   Medications:  Hydrocortisone Fludrocortisone  Review of Systems: As in HPI, otherwise 10 pt ROS was negative.  OBJECTIVE: Temp:  [97.4 F (36.3 C)-98.2 F (36.8 C)] 97.4 F (36.3 C)  (01/03 0537) Pulse Rate:  [84-103] 84 (01/03 0537) Resp:  [18-20] 18 (01/03 0537) BP: (92-102)/(41-43) 102/41 mmHg (01/03 0537) SpO2:  [95 %-98 %] 98 % (01/03 0537) Weight:  [71.85 kg (158 lb 6.4 oz)] 71.85 kg (158 lb 6.4 oz) (01/03 0500)  Temp (24hrs), Avg:97.9 F (36.6 C), Min:97.4 F (36.3 C), Max:98.2 F (36.8 C)  Weight: 71.85 kg (158 lb 6.4 oz)  Physical Exam: Gen: no acute distress, well-appearing Skin: tanned  HEENT: [place]/AT, eyes anicteric, EOMI, mucous membranes dry, no oropharyngeal lesions Neck: no thyroid enlargement or nodules noted, no cervical lymphadenopathy CAD: regular rate, regular rhythm. No murmur rubs or gallops PULM: clear to ausculation, no wheezes, rhonchi or rales. GI: soft, non tender, non distended. EXT: no clubbing, cyanosis or edema Skin: warm, dry, no rash Neuro: grossly non focal, normal DTRs, alert and oriented x 3  Labs:  CBC Latest Ref Rng 08/30/2015 08/29/2015 08/28/2015  WBC 3.8 - 10.6 K/uL 3.5(L) 4.1 7.4  Hemoglobin 13.0 - 18.0 g/dL 1.6(X) 11.0(L) 13.3  Hematocrit 40.0 - 52.0 % 29.6(L) 33.3(L) 40.6  Platelets 150 - 440 K/uL 239 212 251    BMP Latest Ref Rng 08/30/2015 08/29/2015 08/29/2015  Glucose 65 - 99 mg/dL 096(E) 454(U) 981(X)  BUN 6 - 20 mg/dL 9 14 18   Creatinine 0.61 - 1.24 mg/dL 9.14 7.82 9.56(O)  Sodium 135 - 145 mmol/L 137 129(L) 127(L)  Potassium 3.5 - 5.1 mmol/L 4.0 4.3 4.2  Chloride 101 - 111 mmol/L 109 102 99(L)  CO2 22 - 32 mmol/L 20(L) 19(L) 18(L)  Calcium 8.9 - 10.3 mg/dL 1.3(Y) 8.6(V) 7.8(I)  Blood glucose values reviewed in glucose accordion view  ASSESSMENT:  1. Addison's disease - stable  2. Adrenal crisis - resolved  RECOMMENDATIONS:  Discontinue IV fluids since he is eating and drinking fine Start hydrocortisone 40 mg at 9 am, 20 mg 4 pm today Then lower to 20 mg am, 10 mg pm upon discharge, to be continued for 48 hours Then lower to 10 mg am, 5 mg pm  Continue Florinef 0.1 mg twice daily Advised patient  contact his Endocrinologist in [place] asap to schedule a f/u visit in 1-2 weeks. Reviewed sick day rules and instructed him to double or triple his hydrocortisone dose in acute illness or trauma Patient verbalized understanding Thank you for this consult.  Doylene CanningAbby Falisa Lamora, MD [facility] End[facility]

## 2015-08-30 NOTE — Progress Notes (Signed)
Pharmacy Antibiotic Follow-up Note  Benjamin Kelley is a 35 y.o. year-old male admitted on 08/28/2015.   Assessment/Plan: Lab called to inform me of positive blood culture, GPC identified as staphlococcal species in aerobic bottle only. Anaerobic bottle still pending but so far remains negative. Talked to Dr. Elpidio AnisSudini, he said he would look at it.  Temp (24hrs), Avg:97.9 F (36.6 C), Min:97.4 F (36.3 C), Max:98.2 F (36.8 C)   Recent Labs Lab 08/28/15 2345 08/29/15 0451 08/30/15 0519  WBC 7.4 4.1 3.5*    Recent Labs Lab 08/28/15 2345 08/29/15 0451 08/29/15 1056  CREATININE 1.50* 1.30* 0.98   Estimated Creatinine Clearance: 96.7 mL/min (by C-G formula based on Cr of 0.98).    No Known Allergies   Thank you for allowing pharmacy to be a part of this patient's care.  Carola FrostNathan A Arshia Rondon Pharm.D., BCPS Clinical Pharmacist 08/30/2015 6:06 AM

## 2015-08-30 NOTE — Care Management (Signed)
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 History of Addison's disease. Patient will need assistance for medication prescriptions. Ran RX and printed coupons [facility]. Patient stated that he has sufficient funds to pay for these. He also stated that his mother would be here to pick him up this afternoon to take him back home to [place]. Patient stated that he has a PCP in [place].

## 2015-08-30 NOTE — Progress Notes (Signed)
*  PRELIMINARY RESULTS* Echocardiogram 2D Echocardiogram has been performed.  Benjamin HousekeeperJerry R Kelley 08/30/2015, 9:27 AM

## 2015-08-30 NOTE — Progress Notes (Signed)
08/30/2015 6:57 PM  BP 102/41 mmHg  Pulse 84  Temp(Src) 97.4 F (36.3 C) (Oral)  Resp 18  Ht 5\' 6"  (1.676 m)  Wt 71.85 kg (158 lb 6.4 oz)  BMI 25.58 kg/m2  SpO2 98% Patient discharged per MD orders. Discharge instructions reviewed with patient and mother, patient and mom  verbalized understanding. IV's removed per policy. Prescriptions discussed and given to patient. Discharged via wheelchair escorted by nursing staff.  Ron ParkerHerron, Wyllow Seigler D, RN

## 2015-08-30 NOTE — Progress Notes (Signed)
ANTIBIOTIC CONSULT NOTE - INITIAL  Pharmacy Consult for vancomycin Indication: bacteremia  No Known Allergies  Patient Measurements: Height: 5\' 6"  (167.6 cm) Weight: 158 lb 6.4 oz (71.85 kg) IBW/kg (Calculated) : 63.8 Adjusted Body Weight: 67 kg  Vital Signs: Temp: 97.4 F (36.3 C) (01/03 0537) Temp Source: Oral (01/03 0537) BP: 102/41 mmHg (01/03 0537) Pulse Rate: 84 (01/03 0537) Intake/Output from previous day: 01/02 0701 - 01/03 0700 In: 3057 [P.O.:840; I.V.:2217] Out: 1400 [Urine:1400] Intake/Output from this shift: Total I/O In: 1771 [P.O.:840; I.V.:931] Out: 600 [Urine:600]  Labs:  Recent Labs  08/28/15 2345 08/29/15 0451 08/29/15 1056 08/30/15 0519  WBC 7.4 4.1  --  3.5*  HGB 13.3 11.0*  --  9.9*  PLT 251 212  --  239  CREATININE 1.50* 1.30* 0.98  --    Estimated Creatinine Clearance: 96.7 mL/min (by C-G formula based on Cr of 0.98). No results for input(s): VANCOTROUGH, VANCOPEAK, VANCORANDOM, GENTTROUGH, GENTPEAK, GENTRANDOM, TOBRATROUGH, TOBRAPEAK, TOBRARND, AMIKACINPEAK, AMIKACINTROU, AMIKACIN in the last 72 hours.   Microbiology: Recent Results (from the past 720 hour(s))  Culture, blood (routine x 2)     Status: None (Preliminary result)   Collection Time: 08/29/15  1:55 AM  Result Value Ref Range Status   Specimen Description BLOOD RIGHT ASSIST CONTROL  Final   Special Requests BOTTLES DRAWN AEROBIC AND ANAEROBIC 4CC  Final   Culture  Setup Time   Final    GRAM POSITIVE COCCI IN CLUSTERS Organism ID to follow AEROBIC BOTTLE ONLY CRITICAL RESULT CALLED TO, READ BACK BY AND VERIFIED WITH: C/NATE Angelus Hoopes 08/30/15 0525 [facility]    Culture PENDING  Incomplete   Report Status PENDING  Incomplete  Blood Culture ID Panel (Reflexed)     Status: Abnormal   Collection Time: 08/29/15  1:55 AM  Result Value Ref Range Status   Enterococcus species NOT DETECTED NOT DETECTED Final   Listeria monocytogenes NOT DETECTED NOT DETECTED Final   Staphylococcus species  DETECTED (A) NOT DETECTED Final    Comment: CRITICAL RESULT CALLED TO, READ BACK BY AND VERIFIED WITH: NATE Arvilla Salada 08/30/15 0600 [facility]    Staphylococcus aureus NOT DETECTED NOT DETECTED Final   Streptococcus species NOT DETECTED NOT DETECTED Final   Streptococcus agalactiae NOT DETECTED NOT DETECTED Final   Streptococcus pneumoniae NOT DETECTED NOT DETECTED Final   Streptococcus pyogenes NOT DETECTED NOT DETECTED Final   Acinetobacter baumannii NOT DETECTED NOT DETECTED Final   Enterobacteriaceae species NOT DETECTED NOT DETECTED Final   Enterobacter cloacae complex NOT DETECTED NOT DETECTED Final   Escherichia coli NOT DETECTED NOT DETECTED Final   Klebsiella oxytoca NOT DETECTED NOT DETECTED Final   Klebsiella pneumoniae NOT DETECTED NOT DETECTED Final   Proteus species NOT DETECTED NOT DETECTED Final   Serratia marcescens NOT DETECTED NOT DETECTED Final   Haemophilus influenzae NOT DETECTED NOT DETECTED Final   Neisseria meningitidis NOT DETECTED NOT DETECTED Final   Pseudomonas aeruginosa NOT DETECTED NOT DETECTED Final   Candida albicans NOT DETECTED NOT DETECTED Final   Candida glabrata NOT DETECTED NOT DETECTED Final   Candida krusei NOT DETECTED NOT DETECTED Final   Candida parapsilosis NOT DETECTED NOT DETECTED Final   Candida tropicalis NOT DETECTED NOT DETECTED Final   Carbapenem resistance NOT DETECTED NOT DETECTED Final   Methicillin resistance NOT DETECTED NOT DETECTED Final   Vancomycin resistance NOT DETECTED NOT DETECTED Final    Medical History: Past Medical History  Diagnosis Date  . Addison's disease (HCC)  Medications:  Infusions:  . sodium chloride 100 mL/hr at 08/29/15 2338   Assessment: 34 yom with staph in blood and fever of 100 degress previously. Pharmacy consulted to dose vancomycin.  Vd 47 L, Ke 0.089 hr-1, T1/2 7.8 hr  Goal of Therapy:  Vancomycin trough level 15-20 mcg/ml  Plan:  Expected duration 7 days with resolution of temperature  and/or normalization of WBC. Vancomycin 750 mg IV Q8H with stacked dosing, second dose approximately 6 hours after first, predicted trough 16 mcg/mL. Pharmacy will continue to follow and adjust as needed to maintain trough 15 to 20 mcg/mL.  Carola FrostNathan A Haileyann Staiger, Pharm.D., BCPS Clinical Pharmacist 08/30/2015,6:18 AM

## 2015-08-30 NOTE — Discharge Instructions (Signed)
Activity as tolerated Diet regular Follow-up with primary care physician in a week Follow-up with endocrinologist in a week-call and make appointment Take medications regularly as prescribed

## 2015-08-30 NOTE — Progress Notes (Signed)
Patient away for ECHO at time of visit. Consult for adrenal crisis. He was receiving hydrocortisone 100 mg IV q8hr but since discontinued. On fludrocortisone 0.1 mg bid.  Recommend: Start po hydrocortisone 40 mg in am, 20 mg pm  Continue fludrocortisone 0.1 mg bid Full consult will be completed later today. Will follow along  Doylene CanningAbby Elanda Garmany, MD [facility] Endocrinology

## 2015-08-30 NOTE — Progress Notes (Addendum)
Lab called with Staph in blood. Had fever upto 100 previously. Start vancomycin. Ordered echo and repeat blood cx

## 2015-08-30 NOTE — Discharge Summary (Addendum)
[facility] [place] at [facility]   PATIENT NAME: Benjamin Kelley    MR#:  [id number]  DATE OF BIRTH:  1980/12/04  DATE OF ADMISSION:  08/28/2015 ADMITTING PHYSICIAN: Milagros Loll, MD  DATE OF DISCHARGE: 1/3/ 2017  PRIMARY CARE PHYSICIAN: No primary care provider on file.    ADMISSION DIAGNOSIS:  Addison disease (HCC) [E27.1] Confusion [R41.0] Hyponatremia [E87.1] Metabolic acidosis [E87.2] Hypoglycemia [E16.2]  DISCHARGE DIAGNOSIS:  Active Problems:   Adrenal crisis (HCC)   SECONDARY DIAGNOSIS:   Past Medical History  Diagnosis Date  . Addison's disease [facility])     HOSPITAL COURSE:    35 year old male with history of adrenal crisis on Florinef at home brought in for altered mental status. Patient reports that he stopped taking his medications for the past few days.  1. Adrenal crisis: This is due to noncompliance with Florinef. Clinically improving. Appreciate endocrinology recommendations.  Taper high-dose hydrocortisone steroids, and patient is to continue taking hydrocortisone 10 mg every morning and 5 mg daily at bedtime until seen by patient's endocrinologist.  Patient is restarted on Florinef. Discontinue IV fluids  2. Hypoglycemia: Patient is on D5 half-normal saline. Hypoglycemia improved and her D5 half normal saline is discontinued. I will repeat sodium level. If sodium level has improved then I can stop D5 half-normal saline.   3. Metabolic encephalopathy: Due to adrenal crisis. This is resolved.   4. Hyponatremia: Likely in the setting of hypovolemia and adrenal crises.  Repeat sodium level is at 137.   5. Acute kidney injury: This is due to hypovolemia. Improved with IV fluids  6.? Sepsis with low-grade fever Blood cultures collected on January 2, one bottle is negative other bottles with Staphylococcus which is a contaminant Repeat blood cultures on January 3 are negative so far-urine cultures also negative so far.  Primary care physician has to follow up on the final culture results which are pending at this time. Not discharging with any antibiotics   DISCHARGE CONDITIONS:   Fair   CONSULTS OBTAINED:  Treatment Team:  Newt Minion, MD   PROCEDURES none  DRUG ALLERGIES:  No Known Allergies  DISCHARGE MEDICATIONS:   Current Discharge Medication List    START taking these medications   Details  hydrocortisone (CORTEF) 5 MG tablet Take hydrocortisone 20 mg(5 mg 4) tonight Take hydrocortisone 20 mg every morning and 10 mg daily at bedtime for 2 days-January 4 and 09/01/2015 Take hydrocortisone 10 mg by mouth every morning and 5 mg daily at bedtime starting from 09/02/2015 and continue until seen by endocrinologist Qty: 100 tablet, Refills: 0    polyethylene glycol (MIRALAX / GLYCOLAX) packet Take 17 g by mouth daily as needed for mild constipation or moderate constipation. Qty: 14 each, Refills: 0    tobramycin (TOBREX) 0.3 % ophthalmic solution Place 2 drops into both eyes every 4 (four) hours. Qty: 5 mL, Refills: 0      CONTINUE these medications which have CHANGED   Details  fludrocortisone (FLORINEF) 0.1 MG tablet Take 1 tablet (0.1 mg total) by mouth 2 (two) times daily. Qty: 60 tablet, Refills: 0         DISCHARGE INSTRUCTIONS:   Activity as tolerated Follow-up with primary care physician in a week Follow-up with endocrinology in a week Be compliant with his home medications  DIET:  Regular diet  DISCHARGE CONDITION:  Fair  ACTIVITY:  Activity as tolerated  OXYGEN:  Home Oxygen: No.   Oxygen Delivery: room air  DISCHARGE  LOCATION:  home   If you experience worsening of your admission symptoms, develop shortness of breath, life threatening emergency, suicidal or homicidal thoughts you must seek medical attention immediately by calling 911 or calling your MD immediately  if symptoms less severe.  You Must read complete instructions/literature along  with all the possible adverse reactions/side effects for all the Medicines you take and that have been prescribed to you. Take any new Medicines after you have completely understood and accpet all the possible adverse reactions/side effects.   Please note  You were cared for by a hospitalist during your hospital stay. If you have any questions about your discharge medications or the care you received while you were in the hospital after you are discharged, you can call the unit and asked to speak with the hospitalist on call if the hospitalist that took care of you is not available. Once you are discharged, your primary care physician will handle any further medical issues. Please note that NO REFILLS for any discharge medications will be authorized once you are discharged, as it is imperative that you return to your primary care physician (or establish a relationship with a primary care physician if you do not have one) for your aftercare needs so that they can reassess your need for medications and monitor your lab values.     Today  Chief Complaint  Patient presents with  . Altered Mental Status   Patient is feeling fine. Denies any dizziness or loss of consciousness. Significantly improved clinical situation. Wants to be discharged home. Reinforced the importance of being compliant with his home medications, he verbalized understanding  ROS:  CONSTITUTIONAL: Denies fevers, chills. Denies any fatigue, weakness.  EYES: Denies blurry vision, double vision, eye pain. EARS, NOSE, THROAT: Denies tinnitus, ear pain, hearing loss. RESPIRATORY: Denies cough, wheeze, shortness of breath.  CARDIOVASCULAR: Denies chest pain, palpitations, edema.  GASTROINTESTINAL: Denies nausea, vomiting, diarrhea, abdominal pain. Denies bright red blood per rectum. GENITOURINARY: Denies dysuria, hematuria. ENDOCRINE: Denies nocturia or thyroid problems. HEMATOLOGIC AND LYMPHATIC: Denies easy bruising or  bleeding. SKIN: Denies rash or lesion. MUSCULOSKELETAL: Denies pain in neck, back, shoulder, knees, hips or arthritic symptoms.  NEUROLOGIC: Denies paralysis, paresthesias.  PSYCHIATRIC: Denies anxiety or depressive symptoms.   VITAL SIGNS:  Blood pressure 102/41, pulse 84, temperature 97.4 F (36.3 C), temperature source Oral, resp. rate 18, height 5\' 6"  (1.676 m), weight 71.85 kg (158 lb 6.4 oz), SpO2 98 %.  I/O:    Intake/Output Summary (Last 24 hours) at 08/30/15 1441 Last data filed at 08/30/15 1300  Gross per 24 hour  Intake   2358 ml  Output   1000 ml  Net   1358 ml    PHYSICAL EXAMINATION:  GENERAL:  35 y.o.-year-old patient lying in the bed with no acute distress.  EYES: Pupils equal, round, reactive to light and accommodation. No scleral icterus. Extraocular muscles intact.  HEENT: Head atraumatic, normocephalic. Oropharynx and nasopharynx clear.  NECK:  Supple, no jugular venous distention. No thyroid enlargement, no tenderness.  LUNGS: Normal breath sounds bilaterally, no wheezing, rales,rhonchi or crepitation. No use of accessory muscles of respiration.  CARDIOVASCULAR: S1, S2 normal. No murmurs, rubs, or gallops.  ABDOMEN: Soft, non-tender, non-distended. Bowel sounds present. No organomegaly or mass.  EXTREMITIES: No pedal edema, cyanosis, or clubbing.  NEUROLOGIC: Cranial nerves II through XII are intact. Muscle strength 5/5 in all extremities. Sensation intact. Gait not checked.  PSYCHIATRIC: The patient is alert and oriented x 3.  SKIN:  No obvious rash, lesion, or ulcer.   DATA REVIEW:   CBC  Recent Labs Lab 08/30/15 0519  WBC 3.5*  HGB 9.9*  HCT 29.6*  PLT 239    Chemistries   Recent Labs Lab 08/29/15 0451  08/30/15 0519  NA 127*  < > 137  K 4.2  < > 4.0  CL 99*  < > 109  CO2 18*  < > 20*  GLUCOSE 382*  < > 180*  BUN 18  < > 9  CREATININE 1.30*  < > 0.76  CALCIUM 8.6*  < > 8.8*  AST 17  --   --   ALT 16*  --   --   ALKPHOS 64  --   --    BILITOT 2.1*  --   --   < > = values in this interval not displayed.  Cardiac Enzymes  Recent Labs Lab 08/28/15 2345  TROPONINI 0.03    Microbiology Results  Results for orders placed or performed during the hospital encounter of 08/28/15  Culture, blood (routine x 2)     Status: None (Preliminary result)   Collection Time: 08/28/15 11:54 PM  Result Value Ref Range Status   Specimen Description BLOOD LEFT ASSIST CONTROL  Final   Special Requests BOTTLES DRAWN AEROBIC AND ANAEROBIC 4CC  Final   Culture NO GROWTH 1 DAY  Final   Report Status PENDING  Incomplete  Culture, blood (routine x 2)     Status: None (Preliminary result)   Collection Time: 08/29/15  1:55 AM  Result Value Ref Range Status   Specimen Description BLOOD RIGHT ASSIST CONTROL  Final   Special Requests BOTTLES DRAWN AEROBIC AND ANAEROBIC 4CC  Final   Culture  Setup Time   Final    GRAM POSITIVE COCCI IN CLUSTERS AEROBIC BOTTLE ONLY CRITICAL RESULT CALLED TO, READ BACK BY AND VERIFIED WITH: C/NATE COOKSON 08/30/15 0525 SJL    Culture   Final    STAPHYLOCOCCUS SPECIES Results consistent with contamination.    Report Status PENDING  Incomplete  Blood Culture ID Panel (Reflexed)     Status: Abnormal   Collection Time: 08/29/15  1:55 AM  Result Value Ref Range Status   Enterococcus species NOT DETECTED NOT DETECTED Final   Listeria monocytogenes NOT DETECTED NOT DETECTED Final   Staphylococcus species DETECTED (A) NOT DETECTED Final    Comment: CRITICAL RESULT CALLED TO, READ BACK BY AND VERIFIED WITH: NATE COOKSON 08/30/15 0600 SJL    Staphylococcus aureus NOT DETECTED NOT DETECTED Final   Streptococcus species NOT DETECTED NOT DETECTED Final   Streptococcus agalactiae NOT DETECTED NOT DETECTED Final   Streptococcus pneumoniae NOT DETECTED NOT DETECTED Final   Streptococcus pyogenes NOT DETECTED NOT DETECTED Final   Acinetobacter baumannii NOT DETECTED NOT DETECTED Final   Enterobacteriaceae species NOT  DETECTED NOT DETECTED Final   Enterobacter cloacae complex NOT DETECTED NOT DETECTED Final   Escherichia coli NOT DETECTED NOT DETECTED Final   Klebsiella oxytoca NOT DETECTED NOT DETECTED Final   Klebsiella pneumoniae NOT DETECTED NOT DETECTED Final   Proteus species NOT DETECTED NOT DETECTED Final   Serratia marcescens NOT DETECTED NOT DETECTED Final   Haemophilus influenzae NOT DETECTED NOT DETECTED Final   Neisseria meningitidis NOT DETECTED NOT DETECTED Final   Pseudomonas aeruginosa NOT DETECTED NOT DETECTED Final   Candida albicans NOT DETECTED NOT DETECTED Final   Candida glabrata NOT DETECTED NOT DETECTED Final   Candida krusei NOT DETECTED NOT DETECTED Final  Candida parapsilosis NOT DETECTED NOT DETECTED Final   Candida tropicalis NOT DETECTED NOT DETECTED Final   Carbapenem resistance NOT DETECTED NOT DETECTED Final   Methicillin resistance NOT DETECTED NOT DETECTED Final   Vancomycin resistance NOT DETECTED NOT DETECTED Final  Urine culture     Status: None   Collection Time: 08/29/15  3:33 AM  Result Value Ref Range Status   Specimen Description URINE, CLEAN CATCH  Final   Special Requests Normal  Final   Culture NO GROWTH 1 DAY  Final   Report Status 08/30/2015 FINAL  Final  CULTURE, BLOOD (ROUTINE X 2) w Reflex to PCR ID Panel     Status: None (Preliminary result)   Collection Time: 08/30/15  6:48 AM  Result Value Ref Range Status   Specimen Description BLOOD LEFT HAND  Final   Special Requests   Final    BOTTLES DRAWN AEROBIC AND ANAEROBIC ANAEROBIC AEROBIC   Culture NO GROWTH < 12 HOURS  Final   Report Status PENDING  Incomplete  CULTURE, BLOOD (ROUTINE X 2) w Reflex to PCR ID Panel     Status: None (Preliminary result)   Collection Time: 08/30/15  6:49 AM  Result Value Ref Range Status   Specimen Description BLOOD RIGHT FA  Final   Special Requests   Final    BOTTLES DRAWN AEROBIC AND ANAEROBIC ANANEROBIC AEROBIC   Culture NO GROWTH < 12  HOURS  Final   Report Status PENDING  Incomplete    RADIOLOGY:  Ct Head Wo Contrast  08/29/2015  CLINICAL DATA:  Altered mental status. EXAM: CT HEAD WITHOUT CONTRAST TECHNIQUE: Contiguous axial images were obtained from the base of the skull through the vertex without intravenous contrast. COMPARISON:  None. FINDINGS: No intracranial hemorrhage, mass effect, or midline shift. No hydrocephalus. The basilar cisterns are patent. No evidence of territorial infarct. No intracranial fluid collection. Mild scalp thickening of the posterior vertex. Calvarium is intact. Minimal mucosal thickening of the ethmoid air cells, no fluid levels. The mastoid air cells are well aerated. IMPRESSION: No acute intracranial abnormality. Electronically Signed   By: Rubye Oaks M.D.   On: 08/29/2015 00:37   Dg Chest Port 1 View  08/29/2015  CLINICAL DATA:  Altered mental status, confusion. EXAM: PORTABLE CHEST 1 VIEW COMPARISON:  None. FINDINGS: The cardiomediastinal contours are normal. The lungs are clear. Pulmonary vasculature is normal. No consolidation, pleural effusion, or pneumothorax. No acute osseous abnormalities are seen. IMPRESSION: No acute pulmonary process. Electronically Signed   By: Rubye Oaks M.D.   On: 08/29/2015 00:47    EKG:  No orders found for this or any previous visit.    Management plans discussed with the patient, family and they are in agreement.  CODE STATUS:     Code Status Orders        Start     Ordered   08/29/15 0117  Full code   Continuous     08/29/15 0117      TOTAL TIME TAKING CARE OF THIS PATIENT: 45  minutes.    @[facility] @  on 08/30/2015 at 2:41 PM  Between 7am to 6pm - Pager - [phone]  After 6pm go to [email or web address] - password [email or web address]  [facility]  Office  [phone]  CC: Primary care physician; No primary care provider on file.

## 2015-09-03 LAB — CULTURE, BLOOD (ROUTINE X 2): Culture: NO GROWTH

## 2015-09-04 LAB — CULTURE, BLOOD (ROUTINE X 2)
CULTURE: NO GROWTH
CULTURE: NO GROWTH

## 2016-03-27 DEATH — deceased

## 2016-05-10 IMAGING — CT CT HEAD W/O CM
1 of 2 series · 16 of 30 positions shown, 20 images · non-contrast
Comparison: None.

CLINICAL DATA: Altered mental status.

EXAM:
CT HEAD WITHOUT CONTRAST
TECHNIQUE: Contiguous axial images were obtained from the base of the skull
through the vertex without intravenous contrast.

[Series 2: head wo · axial · 0.47mm/px · z∈[-188,-62]mm · 16 of 32 slices shown, 20 images]
[im 2/32  brain]
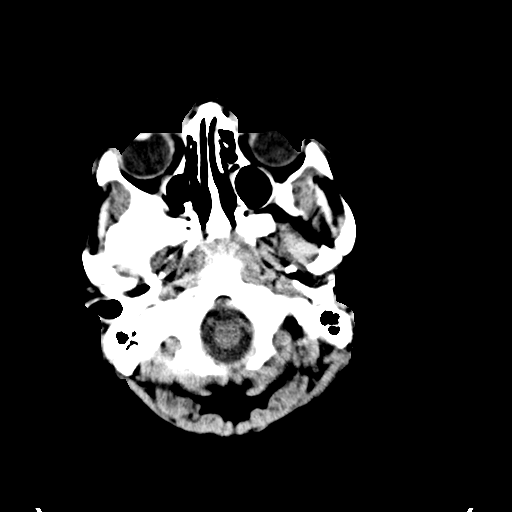
[im 2/32  bone]
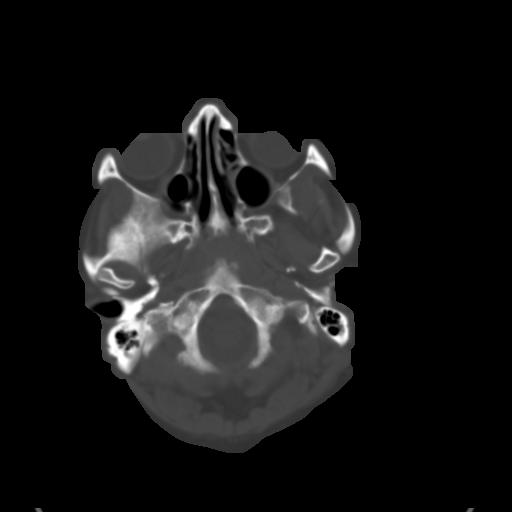
[im 3/32  brain]
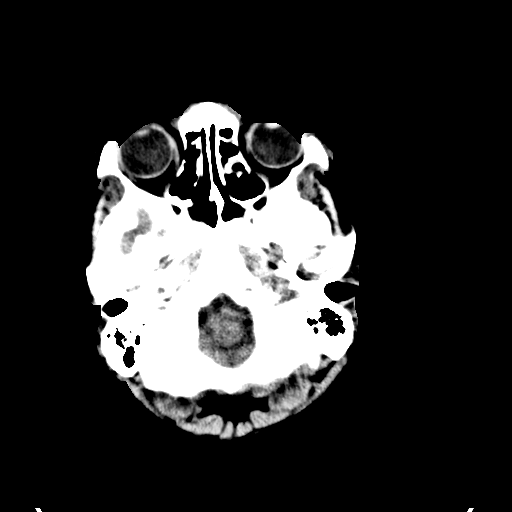
[im 6/32  brain]
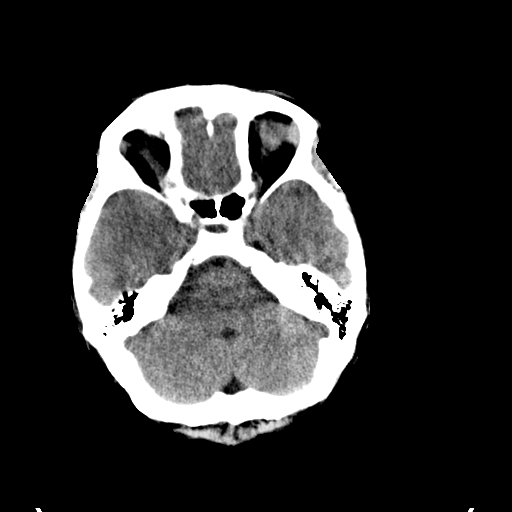
[im 8/32  brain]
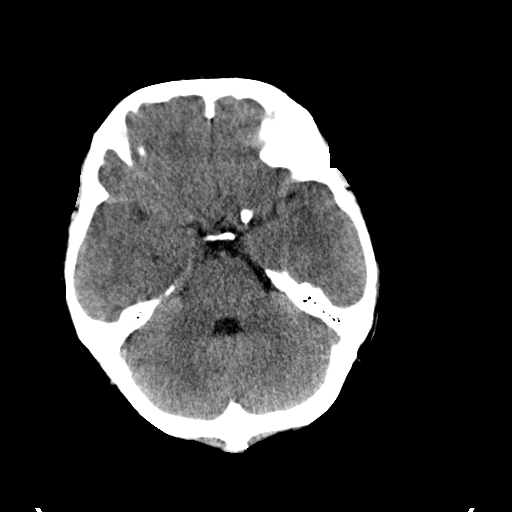
[im 9/32  brain]
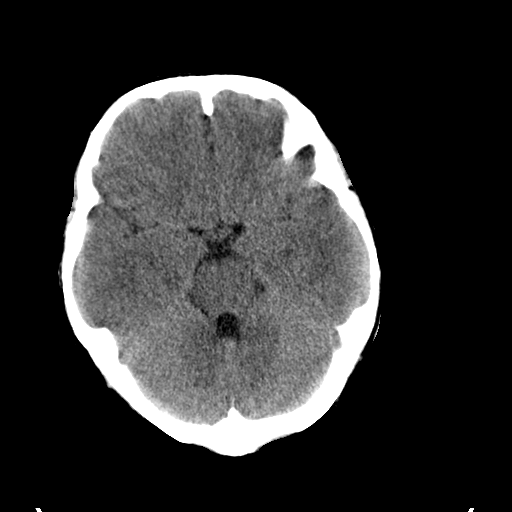
[im 9/32  bone]
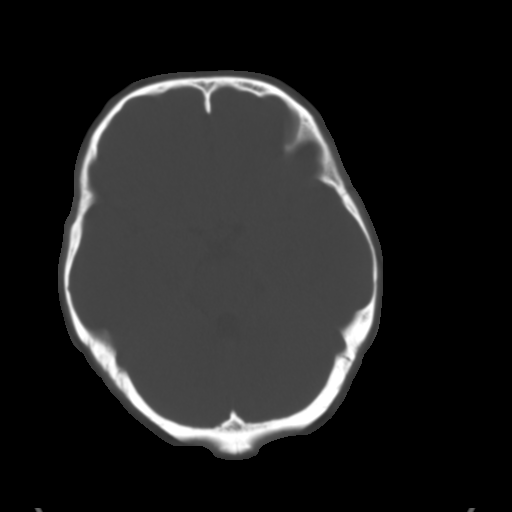
[im 11/32  brain]
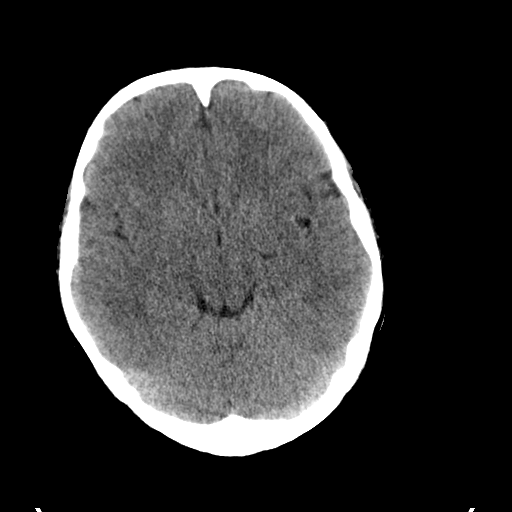
[im 14/32  brain]
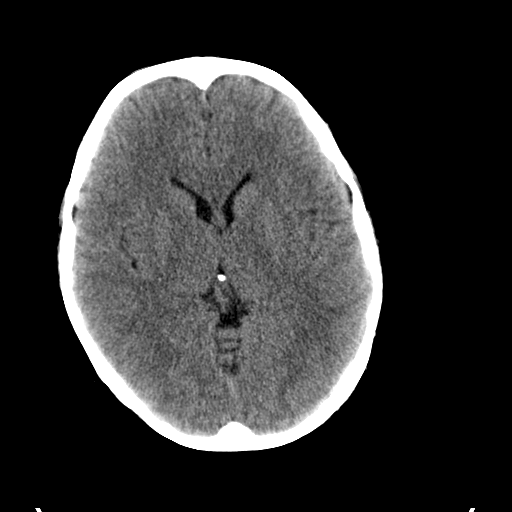
[im 15/32  brain]
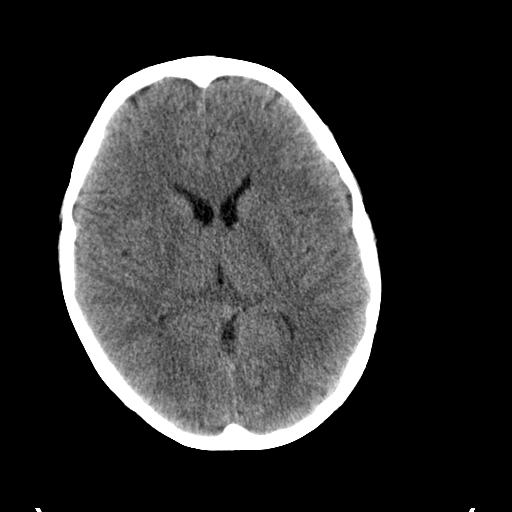
[im 17/32  brain]
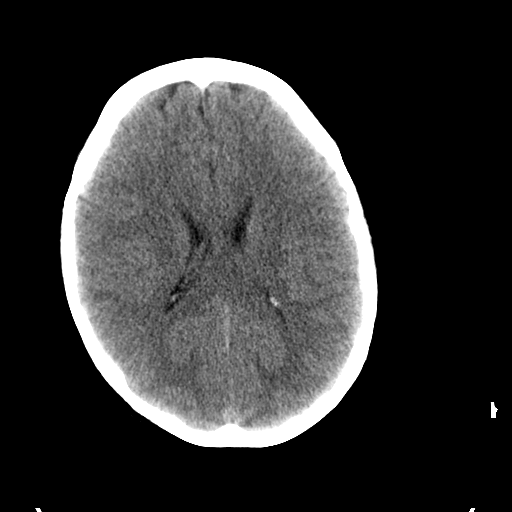
[im 17/32  bone]
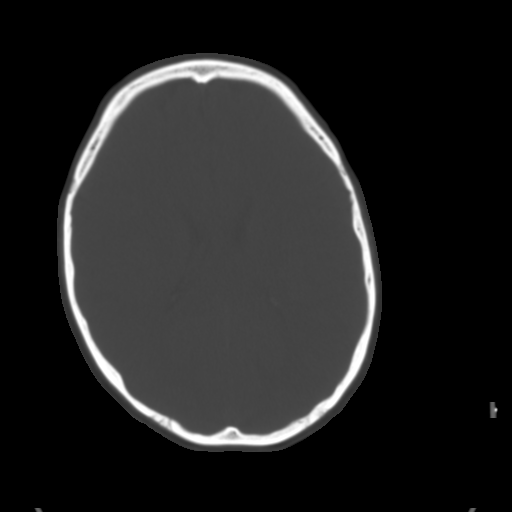
[im 18/32  brain]
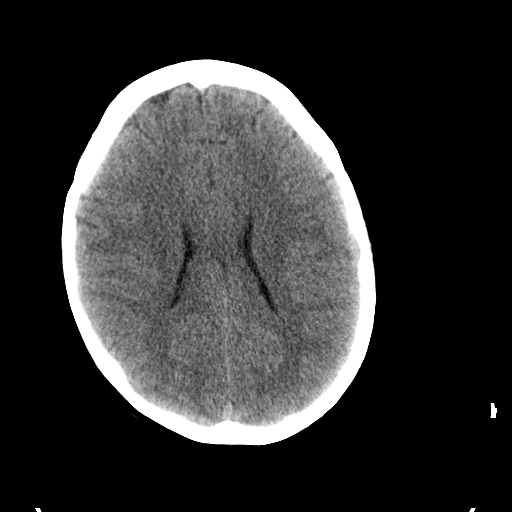
[im 21/32  brain]
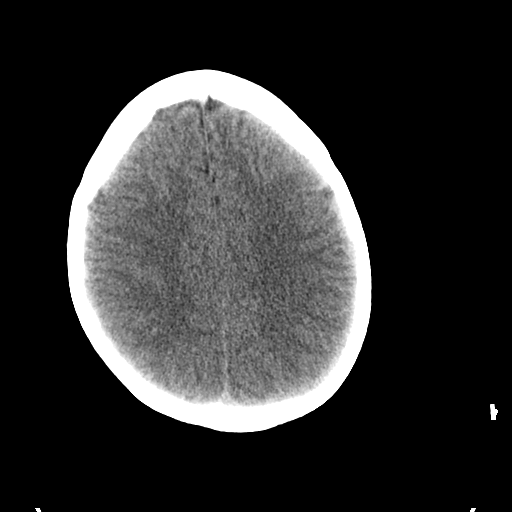
[im 23/32  brain]
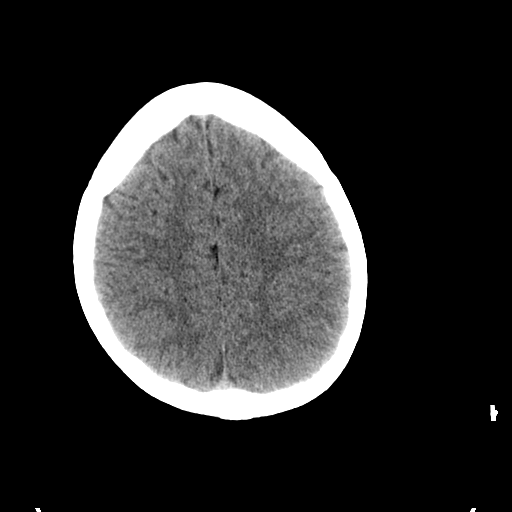
[im 24/32  brain]
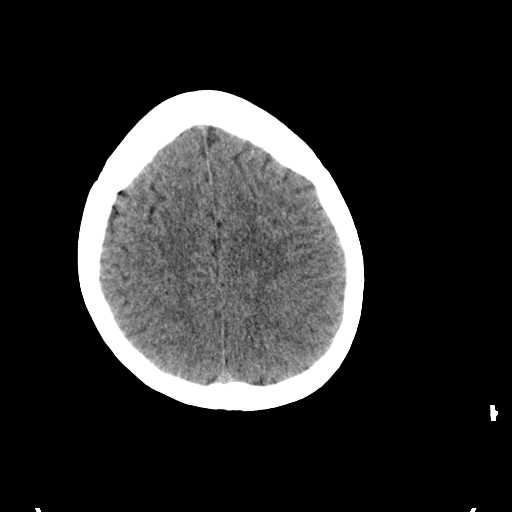
[im 24/32  bone]
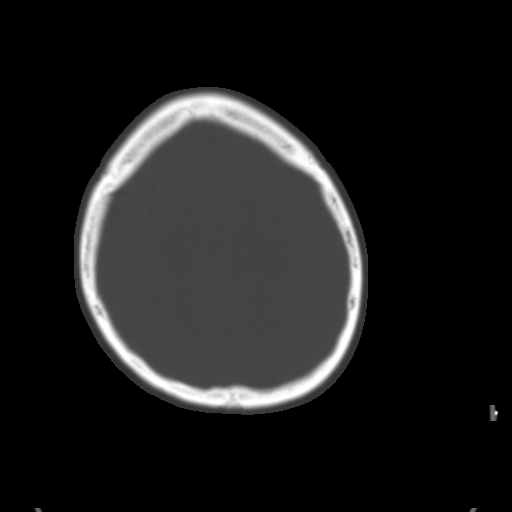
[im 26/32  brain]
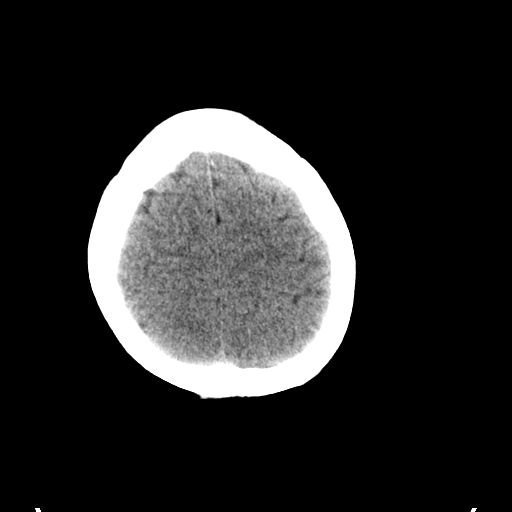
[im 29/32  brain]
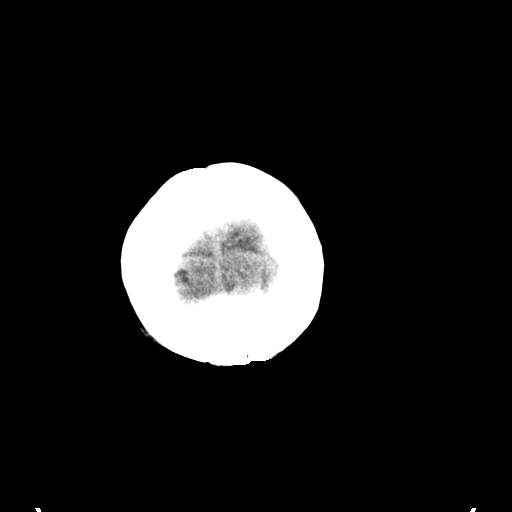
[im 30/32  brain]
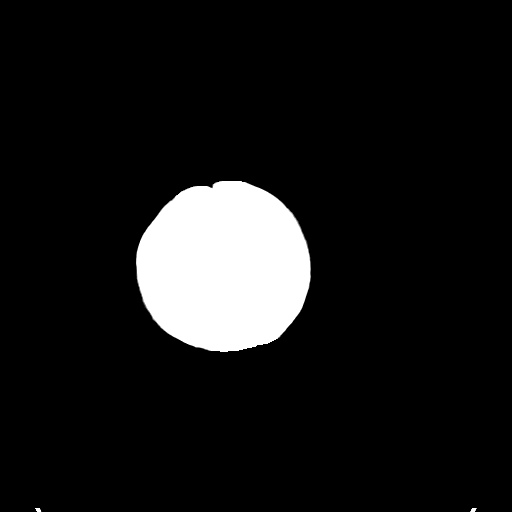

[16 of 30 positions shown; findings below may reference images not displayed]

FINDINGS: No intracranial hemorrhage, mass effect, or midline shift. No
hydrocephalus. The basilar cisterns are patent. No evidence of
territorial infarct. No intracranial fluid collection. Mild scalp
thickening of the posterior vertex. Calvarium is intact. Minimal
mucosal thickening of the ethmoid air cells, no fluid levels. The
mastoid air cells are well aerated.
IMPRESSION: No acute intracranial abnormality.
